# Patient Record
Sex: Male | Born: 1990 | Race: Black or African American | Hispanic: No | Marital: Single | Smoking: Current every day smoker
Health system: Southern US, Community
[De-identification: ages and names within clinical notes are randomized; demographics above are authoritative.]

---
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
## 2009-04-13 ENCOUNTER — Emergency Department (HOSPITAL_COMMUNITY): Admission: EM | Admit: 2009-04-13 | Discharge: 2009-04-13 | Payer: Self-pay | Admitting: Family Medicine

## 2010-04-24 LAB — DIFFERENTIAL
Basophils Absolute: 0 10*3/uL (ref 0.0–0.1)
Basophils Relative: 1 % (ref 0–1)
Eosinophils Absolute: 0 10*3/uL (ref 0.0–0.7)
Monocytes Absolute: 0.5 10*3/uL (ref 0.1–1.0)
Monocytes Relative: 9 % (ref 3–12)
Neutro Abs: 4.3 10*3/uL (ref 1.7–7.7)
Neutrophils Relative %: 71 % (ref 43–77)

## 2010-04-24 LAB — CBC
MCHC: 33.9 g/dL (ref 30.0–36.0)
MCV: 87.2 fL (ref 78.0–100.0)
RBC: 4.84 MIL/uL (ref 4.22–5.81)
RDW: 12.3 % (ref 11.5–15.5)

## 2010-04-24 LAB — POCT CARDIAC MARKERS: Myoglobin, poc: 115 ng/mL (ref 12–200)

## 2011-11-16 ENCOUNTER — Emergency Department (HOSPITAL_COMMUNITY)
Admission: EM | Admit: 2011-11-16 | Discharge: 2011-11-16 | Disposition: A | Discharge status: Home / Self Care | Payer: No Typology Code available for payment source | Attending: Emergency Medicine | Admitting: Emergency Medicine

## 2011-11-16 ENCOUNTER — Encounter (HOSPITAL_COMMUNITY): Payer: Self-pay | Admitting: Emergency Medicine

## 2011-11-16 ENCOUNTER — Emergency Department (HOSPITAL_COMMUNITY): Payer: No Typology Code available for payment source

## 2011-11-16 DIAGNOSIS — S46909A Unspecified injury of unspecified muscle, fascia and tendon at shoulder and upper arm level, unspecified arm, initial encounter: Secondary | ICD-10-CM | POA: Insufficient documentation

## 2011-11-16 DIAGNOSIS — Y939 Activity, unspecified: Secondary | ICD-10-CM | POA: Insufficient documentation

## 2011-11-16 DIAGNOSIS — S4980XA Other specified injuries of shoulder and upper arm, unspecified arm, initial encounter: Secondary | ICD-10-CM | POA: Insufficient documentation

## 2011-11-16 DIAGNOSIS — Z79899 Other long term (current) drug therapy: Secondary | ICD-10-CM | POA: Insufficient documentation

## 2011-11-16 MED ORDER — CYCLOBENZAPRINE HCL 10 MG PO TABS: 10.0000 mg | ORAL_TABLET | Freq: Two times a day (BID) | ORAL | Status: AC | PRN

## 2011-11-16 MED ORDER — HYDROCODONE-ACETAMINOPHEN 5-325 MG PO TABS: 2.0000 | ORAL_TABLET | ORAL | Status: AC | PRN

## 2011-11-16 MED ORDER — IBUPROFEN 600 MG PO TABS: 600.0000 mg | ORAL_TABLET | Freq: Four times a day (QID) | ORAL | Status: AC | PRN
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
## 2012-10-31 ENCOUNTER — Emergency Department (HOSPITAL_COMMUNITY)
Admission: EM | Admit: 2012-10-31 | Discharge: 2012-10-31 | Disposition: A | Discharge status: Home / Self Care | Payer: PRIVATE HEALTH INSURANCE | Source: Home / Self Care | Attending: Family Medicine | Admitting: Family Medicine

## 2012-10-31 ENCOUNTER — Encounter (HOSPITAL_COMMUNITY): Payer: Self-pay | Admitting: Emergency Medicine

## 2012-10-31 DIAGNOSIS — K5289 Other specified noninfective gastroenteritis and colitis: Secondary | ICD-10-CM

## 2012-10-31 DIAGNOSIS — K529 Noninfective gastroenteritis and colitis, unspecified: Secondary | ICD-10-CM

## 2012-10-31 MED ORDER — ONDANSETRON 4 MG PO TBDP
ORAL_TABLET | ORAL | Status: AC
  Filled 2012-10-31: qty 2

## 2012-10-31 MED ORDER — ONDANSETRON HCL 4 MG PO TABS: 4.0000 mg | ORAL_TABLET | Freq: Four times a day (QID) | ORAL | Status: AC

## 2012-10-31 MED ORDER — ONDANSETRON 4 MG PO TBDP
8.0000 mg | ORAL_TABLET | Freq: Once | ORAL | Status: AC
  Administered 2012-10-31: 8 mg via ORAL

## 2012-10-31 NOTE — ED Notes (Signed)
Abdominal pain, generalized.  Onset this am after working last night.  One diarrhea stool, one vomiting episode.

## 2012-10-31 NOTE — ED Provider Notes (Signed)
CSN: [id number]     Arrival date & time 10/31/12  0806 History   First MD Initiated Contact with Patient 10/31/12 [phone]     Chief Complaint  Patient presents with  . Abdominal Pain   (Consider location/radiation/quality/duration/timing/severity/associated sxs/prior Treatment) Patient is a 22 y.o. male presenting with abdominal pain. The history is provided by the patient.  Abdominal Pain This is a new problem. The current episode started 3 to 5 hours ago (last vomited approx 1 h ago.). The problem has not changed since onset.Associated symptoms include abdominal pain.    History reviewed. No pertinent past medical history. History reviewed. No pertinent past surgical history. No family history on file. History  Substance Use Topics  . Smoking status: Current Every Day Smoker  . Smokeless tobacco: Not on file  . Alcohol Use: Yes    Review of Systems  Constitutional: Positive for appetite change.  HENT: Negative.   Gastrointestinal: Positive for nausea, vomiting, abdominal pain and diarrhea. Negative for blood in stool.    Allergies  Amoxicillin and Keflex  Home Medications   Current Outpatient Rx  Name  Route  Sig  Dispense  Refill  . Ascorbic Acid (VITAMIN C PO)   Oral   Take 1 tablet by mouth daily.         . cyclobenzaprine (FLEXERIL) 10 MG tablet   Oral   Take 1 tablet (10 mg total) by mouth 2 (two) times daily as needed for muscle spasms.   20 tablet   0   . HYDROcodone-acetaminophen (NORCO/VICODIN) 5-325 MG per tablet   Oral   Take 2 tablets by mouth every 4 (four) hours as needed for pain.   10 tablet   0   . ibuprofen (ADVIL,MOTRIN) 600 MG tablet   Oral   Take 1 tablet (600 mg total) by mouth every 6 (six) hours as needed for pain.   30 tablet   0   . Multiple Vitamin (MULTIVITAMIN WITH MINERALS) TABS   Oral   Take 1 tablet by mouth daily. Men's One a Day         . ondansetron (ZOFRAN) 4 MG tablet   Oral   Take 1 tablet (4 mg total) by mouth  every 6 (six) hours. Prn n/v   6 tablet   0    BP 129/68  Pulse 76  Temp(Src) 98 F (36.7 C) (Oral)  Resp 18  SpO2 100% Physical Exam  Nursing note and vitals reviewed. Constitutional: He is oriented to person, place, and time. He appears well-developed and well-nourished. No distress.  HENT:  Mouth/Throat: Oropharynx is clear and moist.  Neck: Normal range of motion. Neck supple.  Cardiovascular: Regular rhythm.   Pulmonary/Chest: Breath sounds normal.  Abdominal: Soft. Bowel sounds are normal. He exhibits no distension and no mass. There is no hepatosplenomegaly. There is tenderness in the epigastric area. There is no rebound, no guarding, no CVA tenderness, no tenderness at McBurney's point and negative Murphy's sign.  Lymphadenopathy:    He has no cervical adenopathy.  Neurological: He is alert and oriented to person, place, and time.  Skin: Skin is warm and dry.    ED Course  Procedures (including critical care time) Labs Review Labs Reviewed - No data to display Imaging Review No results found.    MDM      Linna Hoff, MD 10/31/12 [phone]

## 2012-10-31 NOTE — ED Notes (Signed)
Patient requesting notes for work and school

## 2013-04-24 ENCOUNTER — Encounter (HOSPITAL_COMMUNITY): Payer: Self-pay | Admitting: Emergency Medicine

## 2013-04-24 ENCOUNTER — Emergency Department (HOSPITAL_COMMUNITY)
Admission: EM | Admit: 2013-04-24 | Discharge: 2013-04-24 | Disposition: A | Discharge status: Home / Self Care | Payer: PRIVATE HEALTH INSURANCE | Source: Home / Self Care

## 2013-04-24 ENCOUNTER — Emergency Department (INDEPENDENT_AMBULATORY_CARE_PROVIDER_SITE_OTHER): Payer: PRIVATE HEALTH INSURANCE

## 2013-04-24 DIAGNOSIS — IMO0002 Reserved for concepts with insufficient information to code with codable children: Secondary | ICD-10-CM

## 2013-04-24 DIAGNOSIS — S6000XA Contusion of unspecified finger without damage to nail, initial encounter: Secondary | ICD-10-CM

## 2013-04-24 DIAGNOSIS — S60011A Contusion of right thumb without damage to nail, initial encounter: Secondary | ICD-10-CM

## 2013-04-24 MED ORDER — DICLOFENAC POTASSIUM 50 MG PO TABS: 50.0000 mg | ORAL_TABLET | Freq: Three times a day (TID) | ORAL | Status: AC

## 2013-04-24 NOTE — ED Provider Notes (Signed)
Medical screening examination/treatment/procedure(s) were performed by resident physician or non-physician practitioner and as supervising physician I was immediately available for consultation/collaboration.   Nicholaus Steinke DOUGLAS MD.   Malosi Hemstreet D Tully Mcinturff, MD 04/24/13 1651 

## 2013-04-24 NOTE — ED Notes (Signed)
Reports slamming right thumb in car door on Saturday.  Swelling and pain.  Hematoma under nail.  Pt is using ice and heat for comfort.

## 2013-04-24 NOTE — Discharge Instructions (Signed)
Contusion A contusion is a deep bruise. Contusions are the result of an injury that caused bleeding under the skin. The contusion may turn blue, purple, or yellow. Minor injuries will give you a painless contusion, but more severe contusions may stay painful and swollen for a few weeks.  CAUSES  A contusion is usually caused by a blow, trauma, or direct force to an area of the body. SYMPTOMS   Swelling and redness of the injured area.  Bruising of the injured area.  Tenderness and soreness of the injured area.  Pain. DIAGNOSIS  The diagnosis can be made by taking a history and physical exam. An X-ray, CT scan, or MRI may be needed to determine if there were any associated injuries, such as fractures. TREATMENT  Specific treatment will depend on what area of the body was injured. In general, the best treatment for a contusion is resting, icing, elevating, and applying cold compresses to the injured area. Over-the-counter medicines may also be recommended for pain control. Ask your caregiver what the best treatment is for your contusion. HOME CARE INSTRUCTIONS   Put ice on the injured area.  Put ice in a plastic bag.  Place a towel between your skin and the bag.  Leave the ice on for 15-20 minutes, 03-04 times a day.  Only take over-the-counter or prescription medicines for pain, discomfort, or fever as directed by your caregiver. Your caregiver may recommend avoiding anti-inflammatory medicines (aspirin, ibuprofen, and naproxen) for 48 hours because these medicines may increase bruising.  Rest the injured area.  If possible, elevate the injured area to reduce swelling. SEEK IMMEDIATE MEDICAL CARE IF:   You have increased bruising or swelling.  You have pain that is getting worse.  Your swelling or pain is not relieved with medicines. MAKE SURE YOU:   Understand these instructions.  Will watch your condition.  Will get help right away if you are not doing well or get  worse. Document Released: 10/14/2004 Document Revised: 03/29/2011 Document Reviewed: 11/09/2010 [facility] Patient Information 2014 [place].  Subungual Hematoma A subungual hematoma is a pocket of blood that collects under the fingernail or toenail. The pressure created by the blood under the nail can cause pain. CAUSES  A subungual hematoma occurs when an injury to the finger or toe causes a blood vessel beneath the nail to break. The injury can occur from a direct blow such as slamming a finger in a door. It can also occur from a repeated injury such as pressure on the foot in a shoe while running. A subungual hematoma is sometimes called runner's toe or tennis toe. SYMPTOMS   Blue or dark blue skin under the nail.  Pain or throbbing in the injured area. DIAGNOSIS  Your caregiver can determine whether you have a subungual hematoma based on your history and a physical exam. If your caregiver thinks you might have a broken (fractured) bone, X-rays may be taken. TREATMENT  Hematomas usually go away on their own over time. Your caregiver may make a hole in the nail to drain the blood. Draining the blood is painless and usually provides significant relief from pain and throbbing. The nail usually grows back normally after this procedure. In some cases, the nail may need to be removed. This is done if there is a cut under the nail that requires stitches (sutures). HOME CARE INSTRUCTIONS   Put ice on the injured area.  Put ice in a plastic bag.  Place a towel between your  skin and the bag.  Leave the ice on for 15-20 minutes, 03-04 times a day for the first 1 to 2 days.  Elevate the injured area to help decrease pain and swelling.  If you were given a bandage, wear it for as long as directed by your caregiver.  If part of your nail falls off, trim the remaining nail gently. This prevents the nail from catching on something and causing further injury.  Only take over-the-counter or  prescription medicines for pain, discomfort, or fever as directed by your caregiver. SEEK IMMEDIATE MEDICAL CARE IF:   You have redness or swelling around the nail.  You have yellowish-white fluid (pus) coming from the nail.  Your pain is not controlled with medicine.  You have a fever. MAKE SURE YOU:  Understand these instructions.  Will watch your condition.  Will get help right away if you are not doing well or get worse. Document Released: 01/02/2000 Document Revised: 03/29/2011 Document Reviewed: 12/23/2010 [facility] Patient Information 2014 [place], [place].

## 2013-04-24 NOTE — ED Provider Notes (Signed)
CSN: [id number]     Arrival date & time 04/24/13  1217 History   First MD Initiated Contact with Patient 04/24/13 1359     Chief Complaint  Patient presents with  . Hand Injury   (Consider location/radiation/quality/duration/timing/severity/associated sxs/prior Treatment) HPI Comments: 23 year old male accidentally slammed a door on his right distal thumb producing pain across the base of the nail. This occurred 4 days ago.   History reviewed. No pertinent past medical history. History reviewed. No pertinent past surgical history. History reviewed. No pertinent family history. History  Substance Use Topics  . Smoking status: Current Every Day Smoker  . Smokeless tobacco: Not on file  . Alcohol Use: Yes    Review of Systems  Constitutional: Negative.   Respiratory: Negative.   Genitourinary: Negative.   Musculoskeletal:       As per HPI  Skin: Negative.   Neurological: Negative for weakness and numbness.    Allergies  Amoxicillin and Keflex  Home Medications   Current Outpatient Rx  Name  Route  Sig  Dispense  Refill  . Ascorbic Acid (VITAMIN C PO)   Oral   Take 1 tablet by mouth daily.         . cyclobenzaprine (FLEXERIL) 10 MG tablet   Oral   Take 1 tablet (10 mg total) by mouth 2 (two) times daily as needed for muscle spasms.   20 tablet   0   . diclofenac (CATAFLAM) 50 MG tablet   Oral   Take 1 tablet (50 mg total) by mouth 3 (three) times daily.   21 tablet   0   . HYDROcodone-acetaminophen (NORCO/VICODIN) 5-325 MG per tablet   Oral   Take 2 tablets by mouth every 4 (four) hours as needed for pain.   10 tablet   0   . ibuprofen (ADVIL,MOTRIN) 600 MG tablet   Oral   Take 1 tablet (600 mg total) by mouth every 6 (six) hours as needed for pain.   30 tablet   0   . Multiple Vitamin (MULTIVITAMIN WITH MINERALS) TABS   Oral   Take 1 tablet by mouth daily. Men's One a Day         . ondansetron (ZOFRAN) 4 MG tablet   Oral   Take 1 tablet (4 mg  total) by mouth every 6 (six) hours. Prn n/v   6 tablet   0    BP 123/70  Pulse 60  Temp(Src) 98.9 F (37.2 C) (Oral)  Resp 12  SpO2 100% Physical Exam  Nursing note and vitals reviewed. Constitutional: He is oriented to person, place, and time. He appears well-developed and well-nourished.  HENT:  Head: Normocephalic and atraumatic.  Eyes: EOM are normal. Left eye exhibits no discharge.  Neck: Normal range of motion. Neck supple.  Musculoskeletal: Normal range of motion.  Tenderness across the distal phalanx of the right thumb. There is a partial subungual hematoma covering the lunula, minor swelling base of nail. Nail is intact. No open areas, drainage or bleeding. No joint tenderness.  Flexion and extension of DIP and PIP intact. Cap refill brisk. Tender over distal phalange  Neurological: He is alert and oriented to person, place, and time. No cranial nerve deficit.  Skin: Skin is warm and dry.  Psychiatric: He has a normal mood and affect.    ED Course  Procedures (including critical care time) Labs Review Labs Reviewed - No data to display Imaging Review Dg Finger Thumb Right  04/24/2013   CLINICAL  DATA:  Traumatic injury with pain  EXAM: RIGHT THUMB 2+V  COMPARISON:  None.  FINDINGS: There is no evidence of fracture or dislocation. There is no evidence of arthropathy or other focal bone abnormality. Soft tissues are unremarkable  IMPRESSION: No acute abnormality noted.   Electronically Signed   By: Alcide CleverMark  Lukens M.D.   On: 04/24/2013 15:02     MDM   1. Contusion of thumb, right      Rest, ice, elevation,splint IP of thumb. cataflam for pain  Hayden Rasmussenavid Jerel Sardina, NP 04/24/13 1530

## 2014-10-03 IMAGING — CR DG FINGER THUMB 2+V*R*
1 series · 1 of 1 positions shown · non-contrast
Comparison: None.

CLINICAL DATA: Traumatic injury with pain

EXAM:
RIGHT THUMB 2+V

[view not recorded]
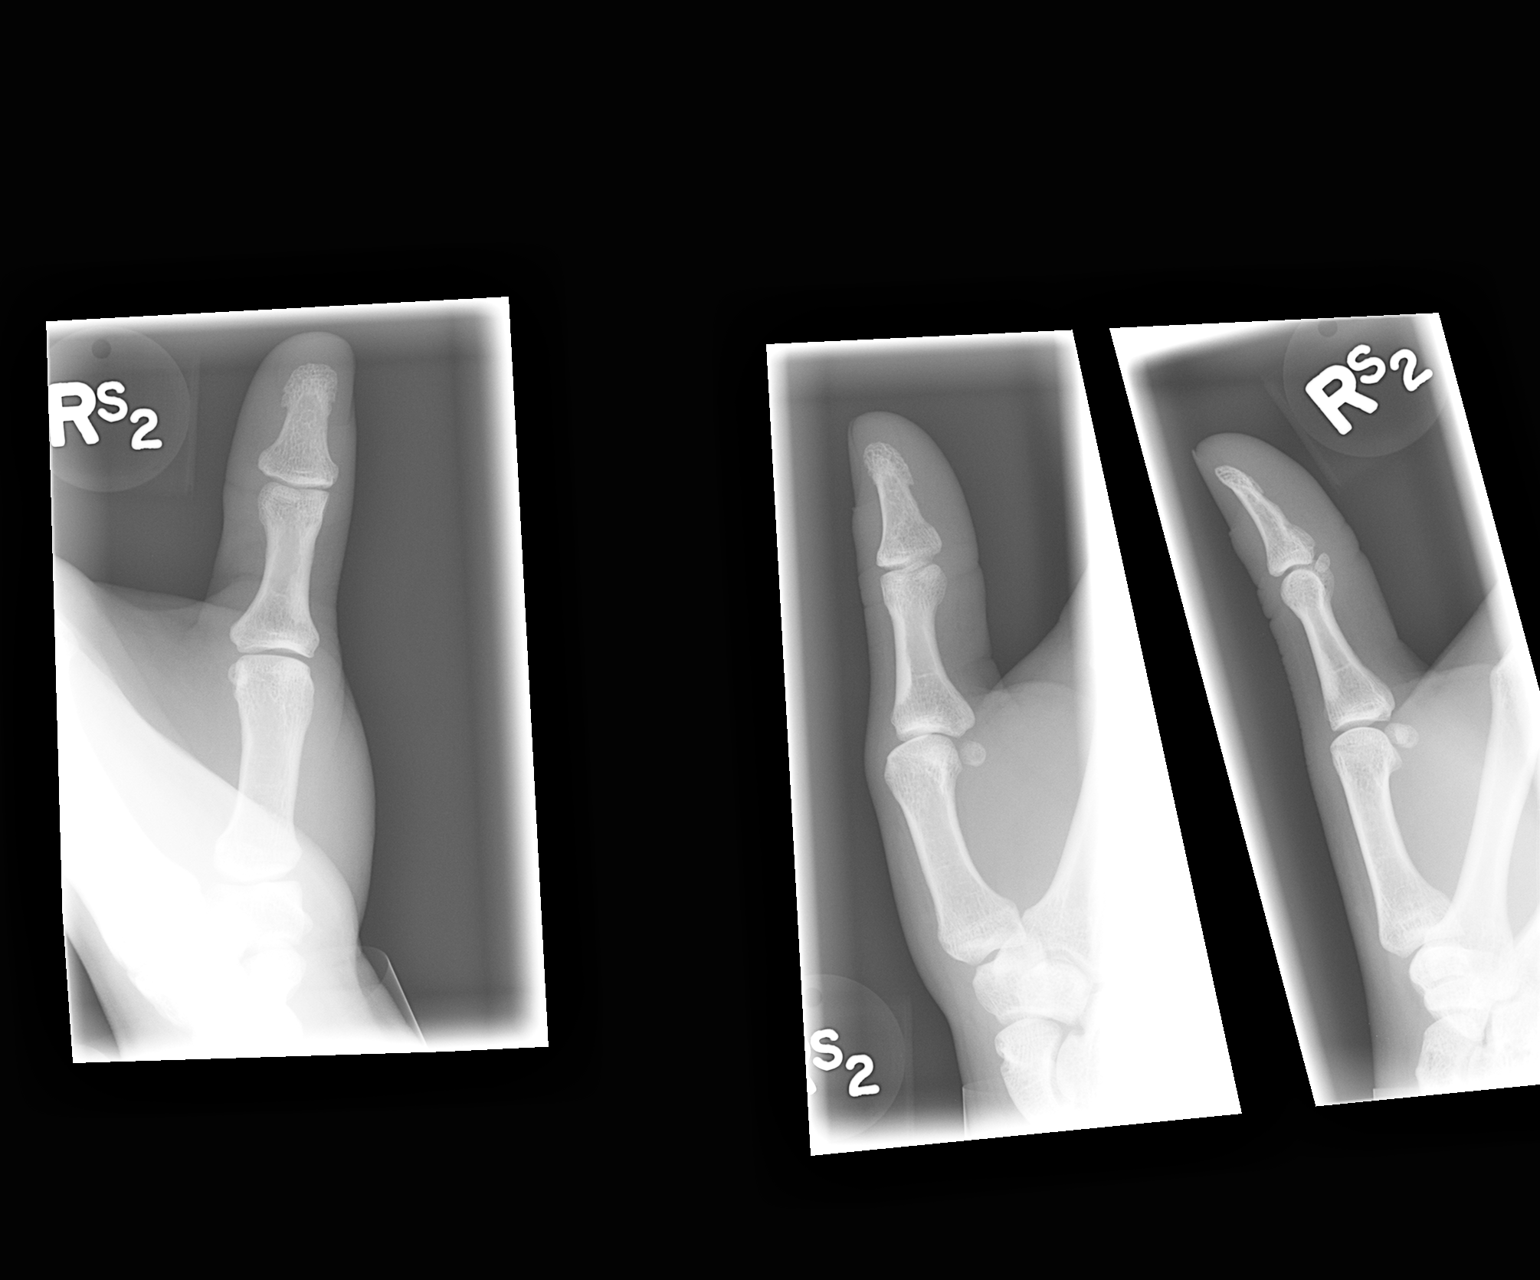

[1 of 1 positions shown; findings below may reference images not displayed]

FINDINGS: There is no evidence of fracture or dislocation. There is no
evidence of arthropathy or other focal bone abnormality. Soft
tissues are unremarkable
IMPRESSION: No acute abnormality noted.
